# Patient Record
Sex: Male | Born: 1990 | Race: Black or African American | Hispanic: No | Marital: Married | Smoking: Never smoker
Health system: Southern US, Community
[De-identification: ages and names within clinical notes are randomized; demographics above are authoritative.]

## PROBLEM LIST (undated history)

## (undated) DIAGNOSIS — J45909 Unspecified asthma, uncomplicated: Secondary | ICD-10-CM

## (undated) HISTORY — PX: EYE SURGERY: SHX253

---

## 2011-05-19 ENCOUNTER — Ambulatory Visit (INDEPENDENT_AMBULATORY_CARE_PROVIDER_SITE_OTHER): Payer: 59 | Admitting: Family Medicine

## 2011-05-19 VITALS — BP 128/74 | HR 64 | Temp 98.5°F | Resp 16 | Ht 66.0 in | Wt 179.0 lb

## 2011-05-19 DIAGNOSIS — Z0000 Encounter for general adult medical examination without abnormal findings: Secondary | ICD-10-CM

## 2011-05-19 NOTE — Progress Notes (Signed)
@[email or web address] @  Patient ID: Tyler Lee MRN: [id number], DOB: 08/21/1990 21 y.o. Date of Encounter: 05/19/2011, 5:56 PM  Primary Physician: No primary provider on file.  Chief Complaint: Sports Physical   HPI: 21 y.o. y/o male with history of noted below here for CPE/sports physical.  Doing well. No issues/complaints.  No sudden death in the family prior to age 1. No syncope with activity. No murmurs or cardiology evaluations.  Here alone.  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  No sequelae Consitutional: No fever, chills, fatigue, night sweats, lymphadenopathy, or weight changes. Eyes: No visual changes, eye redness, or discharge. ENT/Mouth: Ears: No otalgia, tinnitus, hearing loss, discharge. Nose: No congestion, rhinorrhea, sinus pain, or epistaxis. Throat: No sore throat, post nasal drip, or teeth pain. Cardiovascular: No CP, palpitations, diaphoresis, DOE, or edema. Respiratory: No cough, hemoptysis, SOB, or wheezing. Gastrointestinal: No anorexia, dysphagia, reflux, pain, nausea, vomiting, diarrhea, or constipation. Genitourinary: No dysuria, frequency, urgency, hematuria, incontinence, nocturia, or testicular pain/masses. Musculoskeletal: No decreased ROM, myalgias, stiffness, joint swelling, or weakness. Skin: No rash, erythema, lesion changes, pain, warmth, jaundice, or pruritis. Neurological: No headache, dizziness, syncope, seizures, tremors, memory loss, coordination problems, or paresthesias. Psychological: No anxiety, depression, hallucinations, SI/HI. Endocrine: No fatigue, polydipsia, polyphagia, polyuria, or known diabetes. All other systems were reviewed and are otherwise negative.   Home Meds: asthma meds Prior to Admission medications   Medication Sig Start Date End Date  Taking? Authorizing Provider  albuterol (PROVENTIL HFA;VENTOLIN HFA) 108 (90 BASE) MCG/ACT inhaler Inhale 2 puffs into the lungs every 6 (six) hours as needed.   Yes Historical Provider, MD  Fluticasone-Salmeterol (ADVAIR) 250-50 MCG/DOSE AEPB Inhale 1 puff into the lungs every 12 (twelve) hours.   Yes Historical Provider, MD    Allergies:  Allergies  Allergen Reactions  . Penicillins   . Vancomycin     History   Social History  . Marital Status: Single    Spouse Name: N/A    Number of Children: N/A  . Years of Education: N/A   Occupational History  . Not on file.   Social History Main Topics  . Smoking status: Never Smoker   . Smokeless tobacco: Not on file  . Alcohol Use: Not on file  . Drug Use: Not on file  . Sexually Active: Not on file   Other Topics Concern  . Not on file   Social History Narrative  . No narrative on file    No family history on file.none of significance  Physical Exam:see below Blood pressure 128/74, pulse 64, temperature 98.5 F (36.9 C), temperature source Oral, resp. rate 16, height 5\' 6"  (1.676 m), weight 179 lb (81.194 kg).  General: Well developed, well nourished, in no acute distress. HEENT: Normocephalic, atraumatic. Conjunctiva pink, sclera non-icteric. Pupils 2 mm constricting to 1 mm, round, regular, and equally reactive to light and accomodation. EOMI. Vision reviewed. Internal auditory canal clear. TMs with good cone of light and without pathology. Nasal mucosa pink. Nares are without discharge. No sinus tenderness. Oral mucosa pink. Dentition normal. Pharynx without exudate.   Neck: Supple. Trachea midline. No thyromegaly. Full ROM. No lymphadenopathy. Lungs: Clear to auscultation bilaterally without wheezes, rales, or rhonchi. Breathing is of normal effort and unlabored. Cardiovascular: RRR with S1 S2. No murmurs, rubs, or gallops appreciated. Distal pulses 2+ symmetrically. No carotid  or abdominal bruits. Abdomen: Soft,  non-tender, non-distended with normoactive bowel sounds. No hepatosplenomegaly or masses. No rebound/guarding. No CVA tenderness.  Genitourinary: un- circumcised male. No penile lesions. Testes descended bilaterally, and smooth without tenderness or masses. No hernias. Musculoskeletal: Full range of motion and 5/5 strength throughout. Without swelling, atrophy, tenderness, crepitus, or warmth. Extremities without clubbing, cyanosis, or edema. Calves supple. Skin: Warm and moist without erythema, ecchymosis, wounds, or rash. Neuro: A+Ox3. CN II-XII grossly intact. Moves all extremities spontaneously. Full sensation throughout. Normal gait. DTR 2+ throughout upper and lower extremities. Finger to nose intact. Psych:  Responds to questions appropriately with a normal affect.    Assessment/Plan:  21 y.o. y/o male here for sports physical for sports physical. -Cleared -Form completed -RTC prn  Signed, Jahi Roza MD 05/19/2011 5:56 PM

## 2015-08-02 DIAGNOSIS — F4323 Adjustment disorder with mixed anxiety and depressed mood: Secondary | ICD-10-CM | POA: Diagnosis not present

## 2015-08-18 DIAGNOSIS — H5213 Myopia, bilateral: Secondary | ICD-10-CM | POA: Diagnosis not present

## 2015-08-18 DIAGNOSIS — H52223 Regular astigmatism, bilateral: Secondary | ICD-10-CM | POA: Diagnosis not present

## 2015-09-20 DIAGNOSIS — E669 Obesity, unspecified: Secondary | ICD-10-CM | POA: Diagnosis not present

## 2015-09-20 DIAGNOSIS — J45909 Unspecified asthma, uncomplicated: Secondary | ICD-10-CM | POA: Diagnosis not present

## 2015-09-20 DIAGNOSIS — J309 Allergic rhinitis, unspecified: Secondary | ICD-10-CM | POA: Diagnosis not present

## 2015-09-20 DIAGNOSIS — R0683 Snoring: Secondary | ICD-10-CM | POA: Diagnosis not present

## 2016-04-03 DIAGNOSIS — J45909 Unspecified asthma, uncomplicated: Secondary | ICD-10-CM | POA: Diagnosis not present

## 2016-04-03 DIAGNOSIS — Z23 Encounter for immunization: Secondary | ICD-10-CM | POA: Diagnosis not present

## 2016-04-26 DIAGNOSIS — M542 Cervicalgia: Secondary | ICD-10-CM | POA: Diagnosis not present

## 2016-05-25 DIAGNOSIS — J309 Allergic rhinitis, unspecified: Secondary | ICD-10-CM | POA: Diagnosis not present

## 2016-05-25 DIAGNOSIS — J45909 Unspecified asthma, uncomplicated: Secondary | ICD-10-CM | POA: Diagnosis not present

## 2016-09-28 DIAGNOSIS — H5213 Myopia, bilateral: Secondary | ICD-10-CM | POA: Diagnosis not present

## 2016-09-28 DIAGNOSIS — H52223 Regular astigmatism, bilateral: Secondary | ICD-10-CM | POA: Diagnosis not present

## 2016-10-03 DIAGNOSIS — J45909 Unspecified asthma, uncomplicated: Secondary | ICD-10-CM | POA: Diagnosis not present

## 2016-10-03 DIAGNOSIS — E669 Obesity, unspecified: Secondary | ICD-10-CM | POA: Diagnosis not present

## 2016-10-03 DIAGNOSIS — R0683 Snoring: Secondary | ICD-10-CM | POA: Diagnosis not present

## 2016-10-03 DIAGNOSIS — J302 Other seasonal allergic rhinitis: Secondary | ICD-10-CM | POA: Diagnosis not present

## 2016-12-29 DIAGNOSIS — F4321 Adjustment disorder with depressed mood: Secondary | ICD-10-CM | POA: Diagnosis not present

## 2017-01-03 DIAGNOSIS — F4321 Adjustment disorder with depressed mood: Secondary | ICD-10-CM | POA: Diagnosis not present

## 2017-01-10 DIAGNOSIS — F4321 Adjustment disorder with depressed mood: Secondary | ICD-10-CM | POA: Diagnosis not present

## 2017-01-19 DIAGNOSIS — F4321 Adjustment disorder with depressed mood: Secondary | ICD-10-CM | POA: Diagnosis not present

## 2017-02-01 DIAGNOSIS — F4310 Post-traumatic stress disorder, unspecified: Secondary | ICD-10-CM | POA: Diagnosis not present

## 2017-02-13 DIAGNOSIS — F4310 Post-traumatic stress disorder, unspecified: Secondary | ICD-10-CM | POA: Diagnosis not present

## 2017-04-23 DIAGNOSIS — R0683 Snoring: Secondary | ICD-10-CM | POA: Diagnosis not present

## 2017-04-23 DIAGNOSIS — J45909 Unspecified asthma, uncomplicated: Secondary | ICD-10-CM | POA: Diagnosis not present

## 2017-05-23 DIAGNOSIS — E669 Obesity, unspecified: Secondary | ICD-10-CM | POA: Diagnosis not present

## 2017-05-23 DIAGNOSIS — N529 Male erectile dysfunction, unspecified: Secondary | ICD-10-CM | POA: Diagnosis not present

## 2017-05-23 DIAGNOSIS — R0683 Snoring: Secondary | ICD-10-CM | POA: Diagnosis not present

## 2017-05-23 DIAGNOSIS — J45909 Unspecified asthma, uncomplicated: Secondary | ICD-10-CM | POA: Diagnosis not present

## 2017-10-24 DIAGNOSIS — Z23 Encounter for immunization: Secondary | ICD-10-CM | POA: Diagnosis not present

## 2017-10-24 DIAGNOSIS — S61412A Laceration without foreign body of left hand, initial encounter: Secondary | ICD-10-CM | POA: Diagnosis not present

## 2017-11-05 DIAGNOSIS — Z4802 Encounter for removal of sutures: Secondary | ICD-10-CM | POA: Diagnosis not present

## 2017-11-19 DIAGNOSIS — E669 Obesity, unspecified: Secondary | ICD-10-CM | POA: Diagnosis not present

## 2017-11-19 DIAGNOSIS — Z13220 Encounter for screening for lipoid disorders: Secondary | ICD-10-CM | POA: Diagnosis not present

## 2017-11-19 DIAGNOSIS — N500 Atrophy of testis: Secondary | ICD-10-CM | POA: Diagnosis not present

## 2017-11-19 DIAGNOSIS — Z0000 Encounter for general adult medical examination without abnormal findings: Secondary | ICD-10-CM | POA: Diagnosis not present

## 2017-11-19 DIAGNOSIS — J45909 Unspecified asthma, uncomplicated: Secondary | ICD-10-CM | POA: Diagnosis not present

## 2018-01-16 DIAGNOSIS — Z23 Encounter for immunization: Secondary | ICD-10-CM | POA: Diagnosis not present

## 2018-06-06 DIAGNOSIS — J309 Allergic rhinitis, unspecified: Secondary | ICD-10-CM | POA: Diagnosis not present

## 2018-06-06 DIAGNOSIS — J45909 Unspecified asthma, uncomplicated: Secondary | ICD-10-CM | POA: Diagnosis not present

## 2018-10-30 DIAGNOSIS — Z20828 Contact with and (suspected) exposure to other viral communicable diseases: Secondary | ICD-10-CM | POA: Diagnosis not present

## 2018-11-25 DIAGNOSIS — E669 Obesity, unspecified: Secondary | ICD-10-CM | POA: Diagnosis not present

## 2018-11-25 DIAGNOSIS — Z23 Encounter for immunization: Secondary | ICD-10-CM | POA: Diagnosis not present

## 2018-11-25 DIAGNOSIS — Z0000 Encounter for general adult medical examination without abnormal findings: Secondary | ICD-10-CM | POA: Diagnosis not present

## 2018-11-25 DIAGNOSIS — J45909 Unspecified asthma, uncomplicated: Secondary | ICD-10-CM | POA: Diagnosis not present

## 2019-02-17 DIAGNOSIS — Z20828 Contact with and (suspected) exposure to other viral communicable diseases: Secondary | ICD-10-CM | POA: Diagnosis not present

## 2019-05-27 DIAGNOSIS — E669 Obesity, unspecified: Secondary | ICD-10-CM | POA: Diagnosis not present

## 2019-05-27 DIAGNOSIS — J45909 Unspecified asthma, uncomplicated: Secondary | ICD-10-CM | POA: Diagnosis not present

## 2019-05-27 DIAGNOSIS — R0683 Snoring: Secondary | ICD-10-CM | POA: Diagnosis not present

## 2019-06-16 DIAGNOSIS — Z20828 Contact with and (suspected) exposure to other viral communicable diseases: Secondary | ICD-10-CM | POA: Diagnosis not present

## 2019-06-25 DIAGNOSIS — G4733 Obstructive sleep apnea (adult) (pediatric): Secondary | ICD-10-CM | POA: Diagnosis not present

## 2019-06-26 DIAGNOSIS — G4733 Obstructive sleep apnea (adult) (pediatric): Secondary | ICD-10-CM | POA: Diagnosis not present

## 2019-07-05 ENCOUNTER — Other Ambulatory Visit: Payer: Self-pay

## 2019-07-05 ENCOUNTER — Encounter: Payer: Self-pay | Admitting: Emergency Medicine

## 2019-07-05 ENCOUNTER — Emergency Department
Admission: EM | Admit: 2019-07-05 | Discharge: 2019-07-05 | Disposition: A | Discharge status: Home / Self Care | Payer: BC Managed Care – PPO | Attending: Student in an Organized Health Care Education/Training Program | Admitting: Student in an Organized Health Care Education/Training Program

## 2019-07-05 DIAGNOSIS — Z79899 Other long term (current) drug therapy: Secondary | ICD-10-CM | POA: Diagnosis not present

## 2019-07-05 DIAGNOSIS — J4541 Moderate persistent asthma with (acute) exacerbation: Secondary | ICD-10-CM | POA: Diagnosis not present

## 2019-07-05 DIAGNOSIS — J4531 Mild persistent asthma with (acute) exacerbation: Secondary | ICD-10-CM | POA: Insufficient documentation

## 2019-07-05 DIAGNOSIS — R0602 Shortness of breath: Secondary | ICD-10-CM | POA: Diagnosis not present

## 2019-07-05 HISTORY — DX: Unspecified asthma, uncomplicated: J45.909

## 2019-07-05 MED ORDER — IPRATROPIUM-ALBUTEROL 0.5-2.5 (3) MG/3ML IN SOLN
3.0000 mL | Freq: Once | RESPIRATORY_TRACT | Status: AC
  Administered 2019-07-05: 3 mL via RESPIRATORY_TRACT
  Filled 2019-07-05: qty 3

## 2019-07-05 MED ORDER — PREDNISONE 20 MG PO TABS
60.0000 mg | ORAL_TABLET | Freq: Once | ORAL | Status: AC
  Administered 2019-07-05: 60 mg via ORAL
  Filled 2019-07-05: qty 3

## 2019-07-05 MED ORDER — PREDNISONE 10 MG PO TABS: ORAL_TABLET | ORAL | 0 refills | Status: DC

## 2019-07-05 NOTE — ED Triage Notes (Signed)
Pt c/o shortness of breath since last night, hx of asthma, wheezing present.

## 2019-07-05 NOTE — ED Notes (Signed)
FIRST NURSE NOTE:  Pt c/o difficulty breathing, ambulatory from parking lot, denied wheelchair on arrival.  Pt reports hx of asthma and states he has to get steroids occasionally.  Pt reports using inhalers at home, pt able to speak in complete sentences on arrival.   Mask in place.

## 2019-07-05 NOTE — ED Notes (Signed)
[employer] not working  

## 2019-07-05 NOTE — Discharge Instructions (Signed)
Follow-up with your primary care provider if any continued problems or concerns.  Continue using your albuterol inhaler as prescribed and your Singulair.  The prednisone was sent to your pharmacy.  Begin this medication tomorrow as you had the first dose in the ED today.  This medication is just once a day.  Follow the instructions tapering down by 1 tablet each day until you are completely finished.

## 2019-07-05 NOTE — ED Provider Notes (Signed)
[facility] Emergency Department Provider Note   ____________________________________________   First MD Initiated Contact with Patient 07/05/19 1000     (approximate)  I have reviewed the triage vital signs and the nursing notes.   HISTORY  Chief Complaint Shortness of Breath and Asthma    HPI Tyler Lee is a 29 y.o. male presents to the ED with exacerbation of his asthma.  Patient states that he has been using his albuterol inhaler more frequently.  Patient has a history of asthma and states that it is worse usually in the spring.  Patient denies any fever, chills, nausea or vomiting.  He is unaware of any exposure to Covid.  He denies any muscle aches.  He states that he last used his inhaler 1 hour prior to arrival.      Past Medical History:  Diagnosis Date  . Asthma     There are no problems to display for this patient.   Prior to Admission medications   Medication Sig Start Date End Date Taking? Authorizing Provider  albuterol (PROVENTIL HFA;VENTOLIN HFA) 108 (90 BASE) MCG/ACT inhaler Inhale 2 puffs into the lungs every 6 (six) hours as needed.    [provider]  Fluticasone-Salmeterol (ADVAIR) 250-50 MCG/DOSE AEPB Inhale 1 puff into the lungs every 12 (twelve) hours.    [provider]  predniSONE (DELTASONE) 10 MG tablet Take 5 tablets tomorrow, on day 2 take 4 tablets, day 3 take 3 tablets, day 4 take 2 tablets, day 5 take 1 tablets 07/05/19   Johnn Hai, PA-C    Allergies Penicillins and Vancomycin  No family history on file.  Social History Social History   Tobacco Use  . Smoking status: Never Smoker  . Smokeless tobacco: Never Used  Substance Use Topics  . Alcohol use: Not on file  . Drug use: Not on file    Review of Systems Constitutional: No fever/chills Eyes: No visual changes. ENT: No sore throat. Cardiovascular: Denies chest pain. Respiratory: Denies shortness of breath.  Positive for  wheezing. Gastrointestinal: No abdominal pain.  No nausea, no vomiting.   Musculoskeletal: Negative for muscle aches. Skin: Negative for rash. Neurological: Negative for headaches, focal weakness or numbness. ____________________________________________   PHYSICAL EXAM:  VITAL SIGNS: ED Triage Vitals [07/05/19 0946]  Enc Vitals Group     BP 139/76     Pulse Rate (!) 121     Resp (!) 22     Temp 97.8 F (36.6 C)     Temp Source Oral     SpO2 94 %     Weight      Height      Head Circumference      Peak Flow      Pain Score 0     Pain Loc      Pain Edu?      Excl. in [place]?     Constitutional: Alert and oriented. Well appearing and in no acute distress.  Patient is able to talk in complete sentences without any shortness of breath. Eyes: Conjunctivae are normal. PERRL. EOMI. Head: Atraumatic. Nose: No congestion/rhinnorhea. Mouth/Throat: Mucous membranes are moist.  Oropharynx non-erythematous. Neck: No stridor.   Cardiovascular: Normal rate, regular rhythm. Grossly normal heart sounds.  Good peripheral circulation. Respiratory: Normal respiratory effort.  No retractions. Lungs expiratory wheeze heard throughout. Gastrointestinal: Soft and nontender. No distention.  Musculoskeletal: Moves upper and lower extremities they have difficulty normal gait was noted. Neurologic:  Normal speech and  language. No gross focal neurologic deficits are appreciated. No gait instability. Skin:  Skin is warm, dry and intact. No rash noted. Psychiatric: Mood and affect are normal. Speech and behavior are normal.  ____________________________________________   LABS (all labs ordered are listed, but only abnormal results are displayed)  Labs Reviewed - No data to display  PROCEDURES  Procedure(s) performed (including Critical Care):  Procedures   ____________________________________________   INITIAL IMPRESSION / ASSESSMENT AND PLAN / ED COURSE  As part of my medical decision  making, I reviewed the following data within the electronic MEDICAL RECORD NUMBER Notes from prior ED visits and [place] Controlled Substance Database  29 year old male presents to the ED with complaint of exacerbation of his asthma.  Patient states he has been using his albuterol inhaler more frequently than usual.  He denies any fever, chills, nausea or vomiting.  He states that this feels like his normal asthma flareup and usually has problems during the pollen season.  Patient has had to take steroids in the past.  Physical exam was consistent with mild persistent asthma with expiratory wheezes heard throughout.  Patient was still able to talk in complete sentences without any difficulty.  Patient was given prednisone 60 mg p.o. and to nebulizer treatments while in the ED.  Patient states he was feeling his much better.  We discussed a continued prednisone taper.  He assures me that he has plenty of refills on his albuterol and Singulair.  He will follow-up with his PCP if any continued problems.  ____________________________________________   FINAL CLINICAL IMPRESSION(S) / ED DIAGNOSES  Final diagnoses:  Mild persistent asthma with exacerbation     ED Discharge Orders         Ordered    predniSONE (DELTASONE) 10 MG tablet     07/05/19 1134           Note:  This document was prepared using Dragon voice recognition software and may include unintentional dictation errors.    Tommi Rumps, PA-C 07/05/19 1608    Willy Eddy, MD 07/06/19 [phone]

## 2019-07-24 DIAGNOSIS — G4733 Obstructive sleep apnea (adult) (pediatric): Secondary | ICD-10-CM | POA: Diagnosis not present

## 2019-08-23 DIAGNOSIS — G4733 Obstructive sleep apnea (adult) (pediatric): Secondary | ICD-10-CM | POA: Diagnosis not present

## 2019-09-23 DIAGNOSIS — G4733 Obstructive sleep apnea (adult) (pediatric): Secondary | ICD-10-CM | POA: Diagnosis not present

## 2019-10-23 DIAGNOSIS — G4733 Obstructive sleep apnea (adult) (pediatric): Secondary | ICD-10-CM | POA: Diagnosis not present

## 2019-11-05 DIAGNOSIS — G4733 Obstructive sleep apnea (adult) (pediatric): Secondary | ICD-10-CM | POA: Diagnosis not present

## 2019-11-28 DIAGNOSIS — G4733 Obstructive sleep apnea (adult) (pediatric): Secondary | ICD-10-CM | POA: Diagnosis not present

## 2019-11-28 DIAGNOSIS — E669 Obesity, unspecified: Secondary | ICD-10-CM | POA: Diagnosis not present

## 2019-11-28 DIAGNOSIS — Z23 Encounter for immunization: Secondary | ICD-10-CM | POA: Diagnosis not present

## 2019-11-28 DIAGNOSIS — Z0000 Encounter for general adult medical examination without abnormal findings: Secondary | ICD-10-CM | POA: Diagnosis not present

## 2019-11-28 DIAGNOSIS — Z6831 Body mass index (BMI) 31.0-31.9, adult: Secondary | ICD-10-CM | POA: Diagnosis not present

## 2019-11-28 DIAGNOSIS — J45909 Unspecified asthma, uncomplicated: Secondary | ICD-10-CM | POA: Diagnosis not present

## 2019-12-24 DIAGNOSIS — G4733 Obstructive sleep apnea (adult) (pediatric): Secondary | ICD-10-CM | POA: Diagnosis not present

## 2020-01-08 ENCOUNTER — Other Ambulatory Visit: Payer: Self-pay | Admitting: Urology

## 2020-01-08 ENCOUNTER — Other Ambulatory Visit (HOSPITAL_COMMUNITY): Payer: Self-pay | Admitting: Urology

## 2020-01-08 DIAGNOSIS — N500 Atrophy of testis: Secondary | ICD-10-CM | POA: Diagnosis not present

## 2020-01-08 DIAGNOSIS — Z3009 Encounter for other general counseling and advice on contraception: Secondary | ICD-10-CM | POA: Diagnosis not present

## 2020-01-22 ENCOUNTER — Ambulatory Visit (HOSPITAL_COMMUNITY): Payer: BC Managed Care – PPO

## 2020-01-23 DIAGNOSIS — G4733 Obstructive sleep apnea (adult) (pediatric): Secondary | ICD-10-CM | POA: Diagnosis not present

## 2020-01-26 DIAGNOSIS — R222 Localized swelling, mass and lump, trunk: Secondary | ICD-10-CM | POA: Diagnosis not present

## 2020-02-05 ENCOUNTER — Other Ambulatory Visit: Payer: Self-pay | Admitting: Internal Medicine

## 2020-02-05 ENCOUNTER — Encounter (HOSPITAL_COMMUNITY): Payer: Self-pay

## 2020-02-05 ENCOUNTER — Ambulatory Visit (HOSPITAL_COMMUNITY): Admission: RE | Admit: 2020-02-05 | Payer: BC Managed Care – PPO | Source: Ambulatory Visit

## 2020-02-05 DIAGNOSIS — N63 Unspecified lump in unspecified breast: Secondary | ICD-10-CM

## 2020-02-18 ENCOUNTER — Ambulatory Visit
Admission: RE | Admit: 2020-02-18 | Discharge: 2020-02-18 | Disposition: A | Discharge status: Home / Self Care | Payer: BC Managed Care – PPO | Source: Ambulatory Visit | Attending: Internal Medicine | Admitting: Internal Medicine

## 2020-02-18 ENCOUNTER — Ambulatory Visit: Payer: BC Managed Care – PPO

## 2020-02-18 ENCOUNTER — Other Ambulatory Visit: Payer: Self-pay

## 2020-02-18 DIAGNOSIS — N63 Unspecified lump in unspecified breast: Secondary | ICD-10-CM

## 2020-02-18 DIAGNOSIS — N62 Hypertrophy of breast: Secondary | ICD-10-CM | POA: Diagnosis not present

## 2020-02-23 DIAGNOSIS — G4733 Obstructive sleep apnea (adult) (pediatric): Secondary | ICD-10-CM | POA: Diagnosis not present

## 2020-03-05 DIAGNOSIS — Z302 Encounter for sterilization: Secondary | ICD-10-CM | POA: Diagnosis not present

## 2020-03-24 DIAGNOSIS — G4733 Obstructive sleep apnea (adult) (pediatric): Secondary | ICD-10-CM | POA: Diagnosis not present

## 2020-03-31 ENCOUNTER — Ambulatory Visit (HOSPITAL_COMMUNITY): Admission: RE | Admit: 2020-03-31 | Payer: BC Managed Care – PPO | Source: Ambulatory Visit

## 2020-03-31 ENCOUNTER — Encounter (HOSPITAL_COMMUNITY): Payer: Self-pay

## 2020-04-24 DIAGNOSIS — G4733 Obstructive sleep apnea (adult) (pediatric): Secondary | ICD-10-CM | POA: Diagnosis not present

## 2020-05-24 DIAGNOSIS — G4733 Obstructive sleep apnea (adult) (pediatric): Secondary | ICD-10-CM | POA: Diagnosis not present

## 2020-05-31 DIAGNOSIS — J302 Other seasonal allergic rhinitis: Secondary | ICD-10-CM | POA: Diagnosis not present

## 2020-05-31 DIAGNOSIS — J45909 Unspecified asthma, uncomplicated: Secondary | ICD-10-CM | POA: Diagnosis not present

## 2020-06-22 DIAGNOSIS — G4733 Obstructive sleep apnea (adult) (pediatric): Secondary | ICD-10-CM | POA: Diagnosis not present

## 2020-06-23 ENCOUNTER — Other Ambulatory Visit: Payer: Self-pay | Admitting: Urology

## 2020-06-23 ENCOUNTER — Other Ambulatory Visit (HOSPITAL_COMMUNITY): Payer: Self-pay | Admitting: Urology

## 2020-06-23 DIAGNOSIS — N500 Atrophy of testis: Secondary | ICD-10-CM

## 2020-07-05 ENCOUNTER — Other Ambulatory Visit: Payer: Self-pay

## 2020-07-05 ENCOUNTER — Other Ambulatory Visit (HOSPITAL_COMMUNITY): Payer: Self-pay | Admitting: Urology

## 2020-07-05 ENCOUNTER — Ambulatory Visit (HOSPITAL_BASED_OUTPATIENT_CLINIC_OR_DEPARTMENT_OTHER)
Admission: RE | Admit: 2020-07-05 | Discharge: 2020-07-05 | Disposition: A | Discharge status: Home / Self Care | Payer: BC Managed Care – PPO | Source: Ambulatory Visit | Attending: Urology | Admitting: Urology

## 2020-07-05 DIAGNOSIS — N500 Atrophy of testis: Secondary | ICD-10-CM

## 2020-07-14 DIAGNOSIS — S161XXA Strain of muscle, fascia and tendon at neck level, initial encounter: Secondary | ICD-10-CM | POA: Diagnosis not present

## 2020-07-14 DIAGNOSIS — S39012A Strain of muscle, fascia and tendon of lower back, initial encounter: Secondary | ICD-10-CM | POA: Diagnosis not present

## 2020-07-23 DIAGNOSIS — G4733 Obstructive sleep apnea (adult) (pediatric): Secondary | ICD-10-CM | POA: Diagnosis not present

## 2020-12-02 DIAGNOSIS — Z23 Encounter for immunization: Secondary | ICD-10-CM | POA: Diagnosis not present

## 2020-12-02 DIAGNOSIS — J45909 Unspecified asthma, uncomplicated: Secondary | ICD-10-CM | POA: Diagnosis not present

## 2020-12-02 DIAGNOSIS — J302 Other seasonal allergic rhinitis: Secondary | ICD-10-CM | POA: Diagnosis not present

## 2020-12-02 DIAGNOSIS — G4733 Obstructive sleep apnea (adult) (pediatric): Secondary | ICD-10-CM | POA: Diagnosis not present

## 2020-12-02 DIAGNOSIS — Z0000 Encounter for general adult medical examination without abnormal findings: Secondary | ICD-10-CM | POA: Diagnosis not present

## 2021-01-12 ENCOUNTER — Institutional Professional Consult (permissible substitution): Payer: BC Managed Care – PPO | Admitting: Internal Medicine

## 2021-05-30 DIAGNOSIS — R195 Other fecal abnormalities: Secondary | ICD-10-CM | POA: Diagnosis not present

## 2021-05-30 DIAGNOSIS — R142 Eructation: Secondary | ICD-10-CM | POA: Diagnosis not present

## 2021-06-29 DIAGNOSIS — J45909 Unspecified asthma, uncomplicated: Secondary | ICD-10-CM | POA: Diagnosis not present

## 2021-06-29 DIAGNOSIS — R030 Elevated blood-pressure reading, without diagnosis of hypertension: Secondary | ICD-10-CM | POA: Diagnosis not present

## 2021-07-07 DIAGNOSIS — R002 Palpitations: Secondary | ICD-10-CM | POA: Diagnosis not present

## 2021-07-07 DIAGNOSIS — R2681 Unsteadiness on feet: Secondary | ICD-10-CM | POA: Diagnosis not present

## 2021-07-07 DIAGNOSIS — G4733 Obstructive sleep apnea (adult) (pediatric): Secondary | ICD-10-CM | POA: Diagnosis not present

## 2021-08-12 DIAGNOSIS — R253 Fasciculation: Secondary | ICD-10-CM | POA: Diagnosis not present

## 2021-08-12 DIAGNOSIS — F419 Anxiety disorder, unspecified: Secondary | ICD-10-CM | POA: Diagnosis not present

## 2021-08-15 ENCOUNTER — Encounter: Payer: Self-pay | Admitting: Neurology

## 2021-08-15 DIAGNOSIS — G4733 Obstructive sleep apnea (adult) (pediatric): Secondary | ICD-10-CM | POA: Diagnosis not present

## 2021-08-17 DIAGNOSIS — H1789 Other corneal scars and opacities: Secondary | ICD-10-CM | POA: Diagnosis not present

## 2021-09-14 DIAGNOSIS — G4733 Obstructive sleep apnea (adult) (pediatric): Secondary | ICD-10-CM | POA: Diagnosis not present

## 2021-09-19 DIAGNOSIS — H5213 Myopia, bilateral: Secondary | ICD-10-CM | POA: Diagnosis not present

## 2021-09-19 DIAGNOSIS — H52223 Regular astigmatism, bilateral: Secondary | ICD-10-CM | POA: Diagnosis not present

## 2021-09-19 DIAGNOSIS — H43812 Vitreous degeneration, left eye: Secondary | ICD-10-CM | POA: Diagnosis not present

## 2021-09-26 DIAGNOSIS — H21341 Primary cyst of pars plana, right eye: Secondary | ICD-10-CM | POA: Diagnosis not present

## 2021-09-26 DIAGNOSIS — H33322 Round hole, left eye: Secondary | ICD-10-CM | POA: Diagnosis not present

## 2021-10-19 DIAGNOSIS — H33322 Round hole, left eye: Secondary | ICD-10-CM | POA: Diagnosis not present

## 2021-10-31 DIAGNOSIS — K625 Hemorrhage of anus and rectum: Secondary | ICD-10-CM | POA: Diagnosis not present

## 2021-11-05 IMAGING — MG DIGITAL DIAGNOSTIC BILAT W/ TOMO W/ CAD
6 of 10 series · 6 of 30 positions shown · non-contrast
Comparison: None.

CLINICAL DATA: 29-year-old male filling a palpable lump in the LEFT
breast for approximately 1-2 months with intermittent tenderness.
This is the patient's initial mammogram. Family history of breast
cancer in his mother at age 47.

EXAM:
DIGITAL DIAGNOSTIC BILATERAL MAMMOGRAM WITH TOMO AND CAD

[R CC synth-2D]
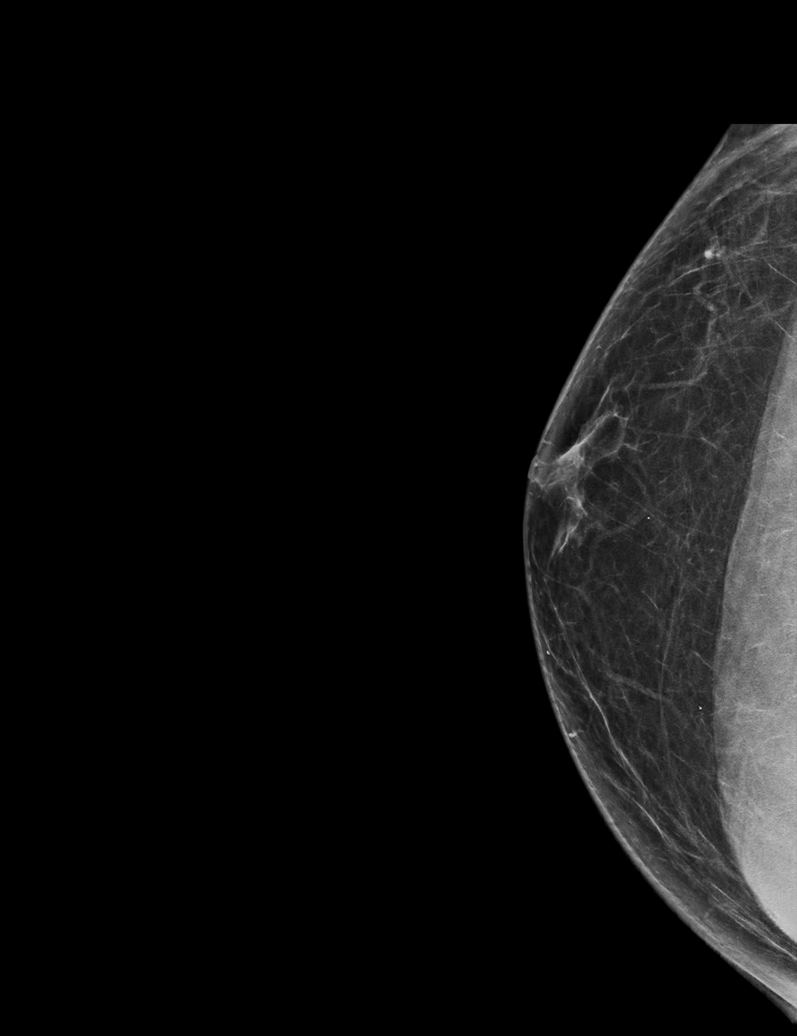

[L CC synth-2D]
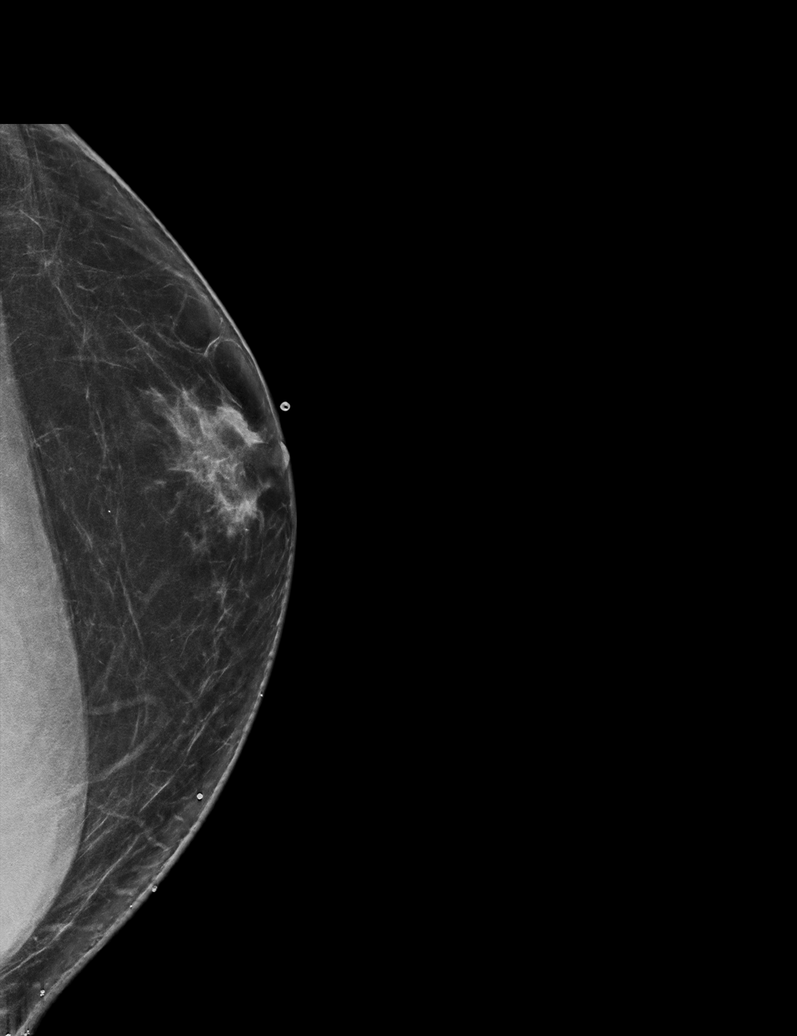

[L TAN synth-2D]
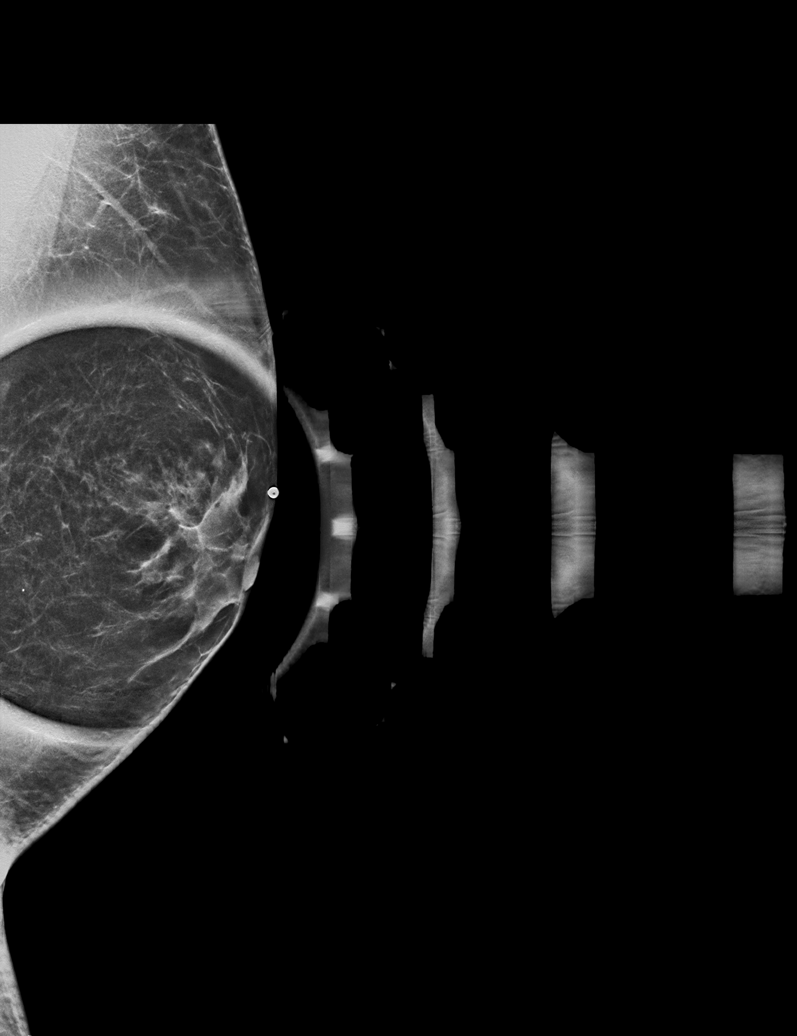

[L MLO synth-2D]
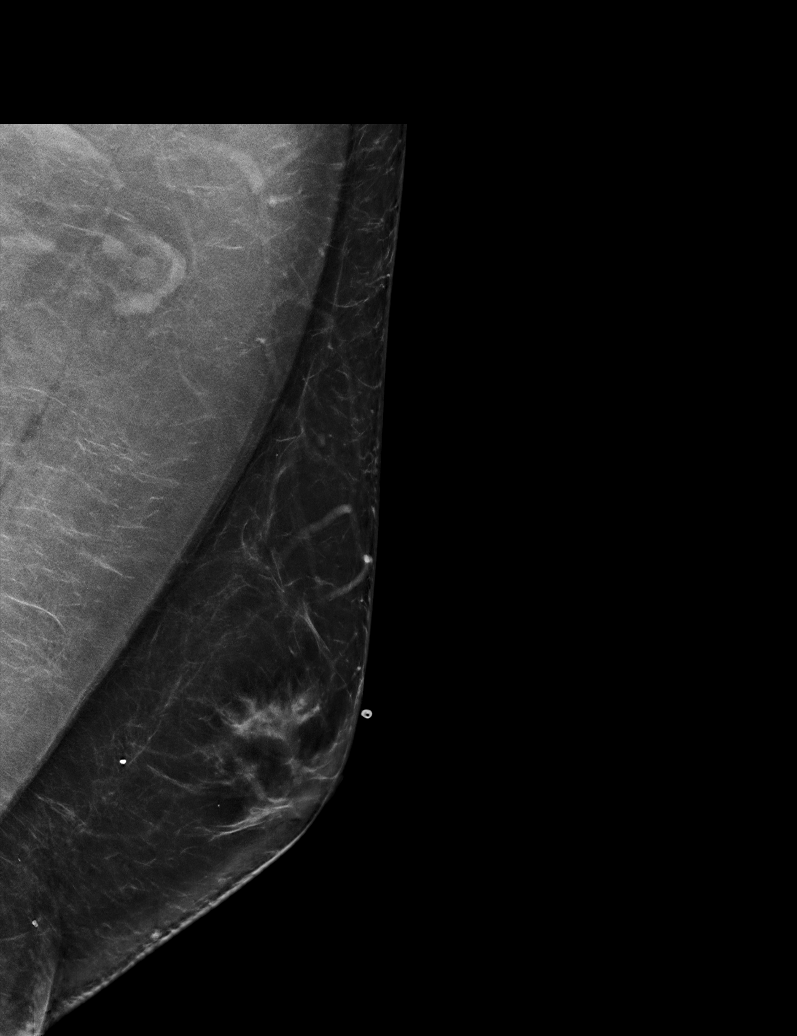

[R MLO synth-2D]
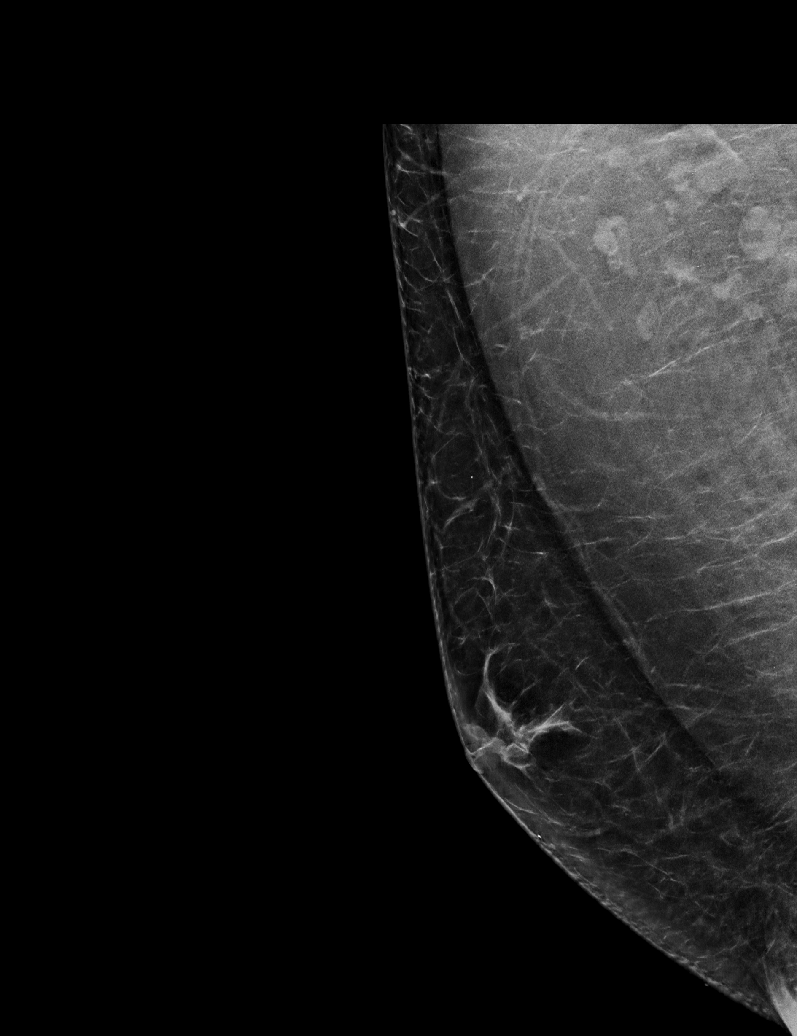

[L MLO tomo · tomo slice 43/85.0]
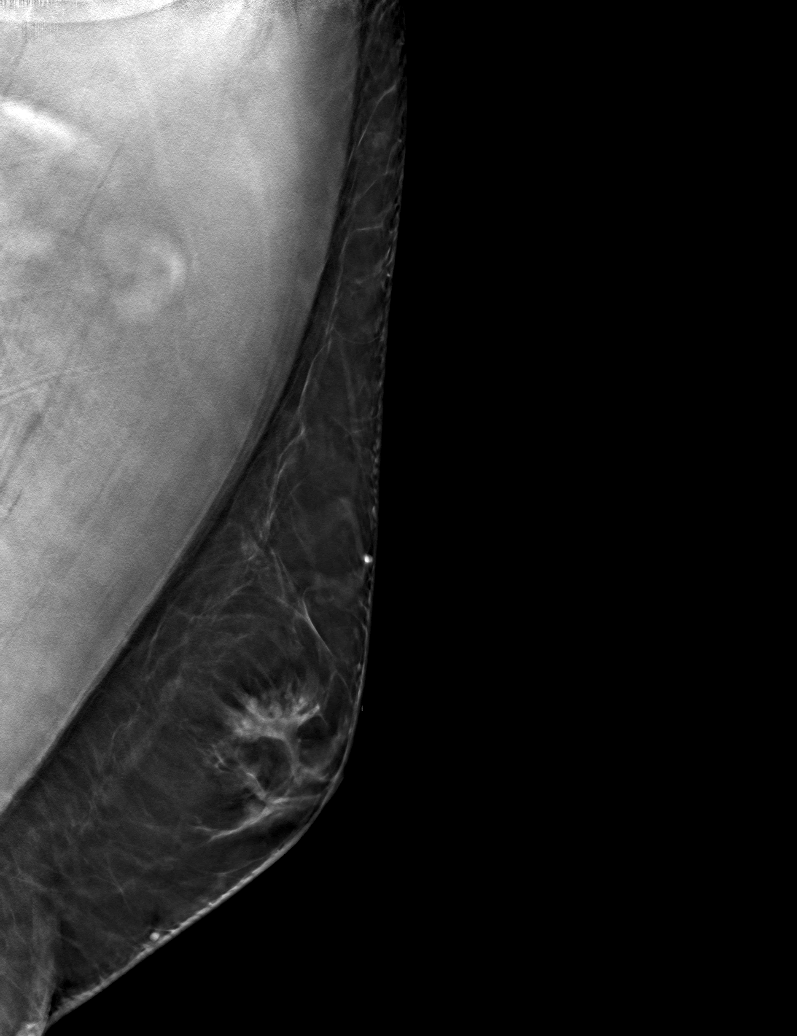

[6 of 30 positions shown; findings below may reference images not displayed]

ACR Breast Density Category b: There are scattered areas of
fibroglandular density.
FINDINGS: Tomosynthesis and synthesized full field CC and MLO views of both
breasts were obtained. Tomosynthesis and synthesized spot
compression tangential view of the subareolar LEFT breast was also
obtained. Mammographic images were processed with CAD.

Moderate to marked LEFT gynecomastia is present. Mild RIGHT
gynecomastia is present.

No findings suspicious for malignancy in either breast.
IMPRESSION: 1. No mammographic evidence of malignancy involving either breast.
2. Moderate to marked LEFT gynecomastia.
3. Mild RIGHT gynecomastia.

RECOMMENDATION:
No further imaging follow-up is felt necessary unless there are
persistent or subsequent clinical concerns.

I discussed with the patient the fact that gynecomastia can occur in
men as testosterone levels decrease with age or in younger men with
low testosterone levels, causing a change in the serum
testosterone:estrogen ratio. We also discussed other potential
etiologies of gynecomastia including numerous prescription
medications and recreational drugs (marijuana and anabolic steroids
in particular). I note that the patient takes albuterol for his
asthma, and gynecomastia is a known side effect of albuterol.
Approximately 20% of cases of gynecomastia are idiopathic.

I counseled the patient to perform self-examination to make sure
that a hard lump does not develop which could indicate malignancy
and would need further evaluation. We also discussed the possibility
of surgical excision if symptoms continue and if an etiology of the
gynecomastia cannot be determined and therefore corrected.

I have discussed the findings and recommendations with the patient.

BI-RADS CATEGORY  2: Benign.

## 2021-11-16 DIAGNOSIS — H31092 Other chorioretinal scars, left eye: Secondary | ICD-10-CM | POA: Diagnosis not present

## 2021-11-16 DIAGNOSIS — H43392 Other vitreous opacities, left eye: Secondary | ICD-10-CM | POA: Diagnosis not present

## 2021-11-16 DIAGNOSIS — H53142 Visual discomfort, left eye: Secondary | ICD-10-CM | POA: Diagnosis not present

## 2021-11-30 DIAGNOSIS — Z0000 Encounter for general adult medical examination without abnormal findings: Secondary | ICD-10-CM | POA: Diagnosis not present

## 2021-11-30 DIAGNOSIS — Z13220 Encounter for screening for lipoid disorders: Secondary | ICD-10-CM | POA: Diagnosis not present

## 2021-11-30 DIAGNOSIS — R748 Abnormal levels of other serum enzymes: Secondary | ICD-10-CM | POA: Diagnosis not present

## 2021-11-30 DIAGNOSIS — H33321 Round hole, right eye: Secondary | ICD-10-CM | POA: Diagnosis not present

## 2021-12-02 ENCOUNTER — Ambulatory Visit: Payer: BC Managed Care – PPO | Admitting: Neurology

## 2022-01-09 ENCOUNTER — Ambulatory Visit: Payer: BC Managed Care – PPO | Admitting: Neurology

## 2022-02-05 NOTE — Progress Notes (Unsigned)
Initial neurology clinic note  SERVICE DATE: 02/06/22  Reason for Evaluation: Consultation requested by Thana Ates, MD for an opinion regarding muscle twitching. My final recommendations will be communicated back to the requesting physician by way of shared medical record or letter to requesting physician via [place] mail.  HPI: This is Mr. Tyler Lee, a 31 y.o. right-handed male with a medical history of asthma and sinus problems/allergy who presents to neurology clinic with the chief complaint of muscle twitching. The patient is alone today.  Patient began having muscle twitching in his left abdomen, temple areas of the head, legs, and arms. It will last ~10 minutes. It resolves on its own. It has been going on since April of 2023. It was worse when he was dehydrated after vacation in May of 2023. He got better with improved hydration. He denies significant change in his strength. It is staying the same (not improving) since improvement after vacation in May of 2023. Patient showed an example from his phone of his left chest that showed a quick twitch near the upper chest/transition into the deltoid area.  Patient also notes "uneasy balance." It was since March or April of 2023. He would notice he was misjudging corners and hitting them. If he turned fast, he may be off balance. He felt unsteady but not dizzy. He denies any falls. He feels his leg strength is good. He thought this could be due to vision. He saw an eye doctor who told him there was something degenerating or detaching. He had laser surgery in July and August in 2023.  Of note, patient had a craniotomy for sinus issues in 2005. He does not know if this has anything to do with this. He has tension in his head with seasonal changes, but no clear pain in the head.  Patient is currently on prednisone 10 mg for asthma currently (for the last week and will continue for another couple of days).  Patient takes zinc drops, B12 drops,  and vit D. He denies significant other supplements or energy drinks. He endorses drinking caffeine (fraps from [employer]) about 2-3 times per week.  There are no neuromuscular respiratory weakness symptoms, particularly orthopnea>dyspnea.   Pseudobulbar affect is absent.  The patient denies any constitutional symptoms like fever, night sweats, anorexia or unintentional weight loss. He has lost about 15 pounds this year. He has tried to watch what he eats and getting more steps.  EtOH use: Currently special occasions, was previously a bigger drinker prior to 2021 (college, etc)  Restrictive diet? No Family history of neuropathy/myopathy/NM disease? No   MEDICATIONS:  Outpatient Encounter Medications as of 02/06/2022  Medication Sig   albuterol (PROVENTIL HFA;VENTOLIN HFA) 108 (90 BASE) MCG/ACT inhaler Inhale 2 puffs into the lungs every 6 (six) hours as needed.   Fluticasone-Salmeterol (ADVAIR) 250-50 MCG/DOSE AEPB Inhale 1 puff into the lungs every 12 (twelve) hours.   predniSONE (DELTASONE) 10 MG tablet Take 5 tablets tomorrow, on day 2 take 4 tablets, day 3 take 3 tablets, day 4 take 2 tablets, day 5 take 1 tablets   No facility-administered encounter medications on file as of 02/06/2022.    PAST MEDICAL HISTORY: Past Medical History:  Diagnosis Date   Asthma     PAST SURGICAL HISTORY: Past Surgical History:  Procedure Laterality Date   EYE SURGERY Bilateral     ALLERGIES: Allergies  Allergen Reactions   Penicillins     Other reaction(s): Other Pt states he can't remember  the reaction. child    Vancomycin     Other reaction(s): Other Patient states "My white blood cell count drops" Lowers blood count     FAMILY HISTORY: Family History  Problem Relation Age of Onset   Breast cancer Mother 103   High blood pressure Mother     SOCIAL HISTORY: Social History   Tobacco Use   Smoking status: Never   Smokeless tobacco: Never  Vaping Use   Vaping Use: Never  used  Substance Use Topics   Alcohol use: Yes    Comment: occas   Drug use: Never   Social History   Social History Narrative   Are you right handed or left handed? Right   Are you currently employed ? 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 Wifeand kids   Caffeine ocas    What type of home do you live in: 1 story or 2 story? two     OBJECTIVE: PHYSICAL EXAM: BP (!) 144/90   Pulse 66   Ht 5\' 7"  (1.702 m)   Wt 235 lb (106.6 kg)   SpO2 99%   BMI 36.81 kg/m   General: General appearance: Awake and alert. No distress. Cooperative with exam.  Skin: No obvious rash or jaundice. HEENT: Atraumatic. Anicteric. Lungs: Non-labored breathing on room air  Extremities: No edema. No obvious deformity.  Musculoskeletal: No obvious joint swelling. Psych: Affect appropriate.  Neurological: Mental Status: Alert. Speech fluent. No pseudobulbar affect Cranial Nerves: CNII: No RAPD. Visual fields grossly intact. CNIII, IV, VI: PERRL. No nystagmus. EOMI. CN V: Facial sensation intact bilaterally to fine touch. [place]eter clench strong. Jaw jerk negative. CN VII: Facial muscles symmetric and strong. No ptosis at rest. CN VIII: Hearing grossly intact bilaterally. CN IX: No hypophonia. CN X: Palate elevates symmetrically. CN XI: Full strength shoulder shrug bilaterally. CN XII: Tongue protrusion full and midline. No atrophy or fasciculations. No significant dysarthria Motor: Tone is normal. No twitching or fasciculations seen today. No atrophy. No grip, eyelid, or percussive myotonia.  Individual muscle group testing (MRC grade out of 5):  Movement     Neck flexion 5    Neck extension 5     Right Left   Shoulder abduction 5 5   Elbow flexion 5 5   Elbow extension 5 5   Finger abduction - FDI 5 5   Finger abduction - ADM 5 5   Finger extension 5 5   Finger distal flexion - 2/3 5 5    Finger distal flexion - 4/5 5 5    Thumb flexion -  FPL 5 5   Thumb abduction - APB 5 5    Hip flexion 5 5   Knee extension 5 5   Knee flexion 5 5   Dorsiflexion 5 5   Plantarflexion 5 5   Inversion 5 5   Eversion 5 5   Great toe extension 5 5   Great toe flexion 5 5     Reflexes:  Right Left   Bicep 2+ 2+   Tricep 2+ 2+   BrRad 2+ 2+   Knee 2+ 2+   Ankle 2+ 2+    Pathological Reflexes: Babinski: mute response bilaterally Hoffman: absent bilaterally Troemner: absent bilaterally Pectoral: absent bilaterally Facial: absent bilaterally Midline tap: absent Sensation: Pinprick: Intact in all extremities Vibration: Intact in all extremities Coordination: Intact finger-to- nose-finger bilaterally. Romberg negative. Gait: Able to rise from chair with arms crossed  unassisted. Normal, narrow-based gait. Able to tandem walk. Able to walk on toes and heels.  Lab and Test Review: External labs: CMP significant for Cr of 1.40 CPK: 119  ASSESSMENT: Tyler Lee is a 31 y.o. male who presents for evaluation of muscle twitching and feeling of imbalance. He has a relevant medical history of asthma and sinus problems s/p surgery. His neurological examination is normal today with no twitching seen. 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 There is no muscle atrophy or weakness or abnormal reflexes to suggest a more sinister etiology such as motor neuron disease (ALS) or MS, which patient was concerned about.   PLAN: -Blood work: ionized Ca, PTH, vit D, CMP, B12 -Over the counter twitching/cramping recommendations given including mag oxide, diet tonic water, hydration, stretching, and avoiding caffeine.  -Return to clinic as needed or with new or worsening symptoms  The impression above as well as the plan as outlined below were extensively discussed with the patient who voiced understanding. All questions were answered to their satisfaction.  When available, results of the above investigations and  possible further recommendations will be communicated to the patient via telephone/MyChart. Patient to call office if not contacted after expected testing turnaround time.   Total time spent reviewing records, interview, history/exam, documentation, and coordination of care on day of encounter:  55 min   Thank you for allowing me to participate in patient's care.  If I can answer any additional questions, I would be pleased to do so.  Jacquelyne Balint, MD   CC: Kirby Funk, MD [address] [place] [address]  CC: Referring provider: Thana Ates, MD [address] [place],  [place] [address]

## 2022-02-06 ENCOUNTER — Other Ambulatory Visit (INDEPENDENT_AMBULATORY_CARE_PROVIDER_SITE_OTHER): Payer: BC Managed Care – PPO

## 2022-02-06 ENCOUNTER — Encounter: Payer: Self-pay | Admitting: Neurology

## 2022-02-06 ENCOUNTER — Ambulatory Visit: Payer: BC Managed Care – PPO | Admitting: Neurology

## 2022-02-06 VITALS — BP 144/90 | HR 66 | Ht 67.0 in | Wt 235.0 lb

## 2022-02-06 DIAGNOSIS — R253 Fasciculation: Secondary | ICD-10-CM

## 2022-02-06 LAB — COMPREHENSIVE METABOLIC PANEL
ALT: 19 U/L (ref 0–53)
AST: 12 U/L (ref 0–37)
Albumin: 4.1 g/dL (ref 3.5–5.2)
Alkaline Phosphatase: 50 U/L (ref 39–117)
BUN: 14 mg/dL (ref 6–23)
CO2: 30 mEq/L (ref 19–32)
Calcium: 9.1 mg/dL (ref 8.4–10.5)
Chloride: 102 mEq/L (ref 96–112)
Creatinine, Ser: 1.23 mg/dL (ref 0.40–1.50)
GFR: 77.3 mL/min (ref >60.00)
Glucose, Bld: 82 mg/dL (ref 70–99)
Potassium: 3.7 mEq/L (ref 3.5–5.1)
Sodium: 139 mEq/L (ref 135–145)
Total Bilirubin: 0.3 mg/dL (ref 0.2–1.2)
Total Protein: 6.9 g/dL (ref 6.0–8.3)

## 2022-02-06 LAB — VITAMIN D 25 HYDROXY (VIT D DEFICIENCY, FRACTURES): VITD: 27.4 ng/mL — ABNORMAL LOW (ref 30.00–100.00)

## 2022-02-06 NOTE — Patient Instructions (Addendum)
Your muscle twitching is likely because of electrolyte imbalance or a condition called "benign fasciculations." As we discussed, there is nothing scary about your exam to suggest a problem such as ALS or MS.  I would like to get labs today to look for imbalances of electrolytes. You will go to 211 on the 2nd floor today for this. I will be in touch when I have your results.  -Recommend the following measures that may provide some symptomatic benefit for muscle twitching and/or cramps: - Adequate oral clear fluid intake to maintain optimal hydration (about 2.5 liters, or around 8-10 glasses per day) Avoidance of caffeine Trial of DIET tonic water: About 1 glass, up to 6 times daily Magnesium oxide up to 400 mg by mouth twice daily, as needed (over the counter) Gentle muscle stretching routine, especially before bedtime  Follow up with me as needed or if anything changes.  The physicians and staff at [facility] are committed to providing excellent care. You may receive a survey requesting feedback about your experience at our office. We strive to receive "very good" responses to the survey questions. If you feel that your experience would prevent you from giving the office a "very good " response, please contact our office to try to remedy the situation. We may be reached at [phone]. Thank you for taking the time out of your busy day to complete the survey.  Jacquelyne Balint, MD [facility]

## 2022-02-07 ENCOUNTER — Telehealth: Payer: Self-pay | Admitting: Neurology

## 2022-02-07 LAB — CALCIUM, IONIZED: Calcium, Ion: 5 mg/dL (ref 4.7–5.5)

## 2022-02-07 LAB — PARATHYROID HORMONE, INTACT (NO CA): PTH: 62 pg/mL (ref 16–77)

## 2022-02-07 NOTE — Telephone Encounter (Signed)
Called and informed pt of results per Dr. Loleta Chance. He understood.

## 2022-02-07 NOTE — Telephone Encounter (Signed)
Pt called in and left a message wanting to get his lab results

## 2022-02-24 ENCOUNTER — Ambulatory Visit: Payer: BC Managed Care – PPO | Admitting: Neurology

## 2022-04-12 DIAGNOSIS — H53142 Visual discomfort, left eye: Secondary | ICD-10-CM | POA: Diagnosis not present

## 2022-04-12 DIAGNOSIS — H43392 Other vitreous opacities, left eye: Secondary | ICD-10-CM | POA: Diagnosis not present

## 2022-04-12 DIAGNOSIS — H43812 Vitreous degeneration, left eye: Secondary | ICD-10-CM | POA: Diagnosis not present

## 2022-04-12 DIAGNOSIS — H31092 Other chorioretinal scars, left eye: Secondary | ICD-10-CM | POA: Diagnosis not present

## 2022-04-26 DIAGNOSIS — H33311 Horseshoe tear of retina without detachment, right eye: Secondary | ICD-10-CM | POA: Diagnosis not present

## 2022-04-26 DIAGNOSIS — H53142 Visual discomfort, left eye: Secondary | ICD-10-CM | POA: Diagnosis not present

## 2022-04-26 DIAGNOSIS — H31093 Other chorioretinal scars, bilateral: Secondary | ICD-10-CM | POA: Diagnosis not present

## 2022-04-26 DIAGNOSIS — H21342 Primary cyst of pars plana, left eye: Secondary | ICD-10-CM | POA: Diagnosis not present

## 2022-05-10 DIAGNOSIS — R195 Other fecal abnormalities: Secondary | ICD-10-CM | POA: Diagnosis not present

## 2022-05-10 DIAGNOSIS — G4733 Obstructive sleep apnea (adult) (pediatric): Secondary | ICD-10-CM | POA: Diagnosis not present

## 2022-05-10 DIAGNOSIS — J45909 Unspecified asthma, uncomplicated: Secondary | ICD-10-CM | POA: Diagnosis not present

## 2022-05-10 DIAGNOSIS — Z79899 Other long term (current) drug therapy: Secondary | ICD-10-CM | POA: Diagnosis not present

## 2022-05-10 DIAGNOSIS — E559 Vitamin D deficiency, unspecified: Secondary | ICD-10-CM | POA: Diagnosis not present

## 2022-05-10 DIAGNOSIS — E669 Obesity, unspecified: Secondary | ICD-10-CM | POA: Diagnosis not present

## 2022-05-17 DIAGNOSIS — H33311 Horseshoe tear of retina without detachment, right eye: Secondary | ICD-10-CM | POA: Diagnosis not present

## 2022-12-04 DIAGNOSIS — E7800 Pure hypercholesterolemia, unspecified: Secondary | ICD-10-CM | POA: Diagnosis not present

## 2022-12-04 DIAGNOSIS — Z0000 Encounter for general adult medical examination without abnormal findings: Secondary | ICD-10-CM | POA: Diagnosis not present

## 2023-11-24 ENCOUNTER — Other Ambulatory Visit: Payer: Self-pay

## 2023-11-24 ENCOUNTER — Emergency Department

## 2023-11-24 ENCOUNTER — Encounter: Payer: Self-pay | Admitting: Emergency Medicine

## 2023-11-24 ENCOUNTER — Emergency Department
Admission: EM | Admit: 2023-11-24 | Discharge: 2023-11-25 | Disposition: A | Discharge status: Home / Self Care | Attending: Emergency Medicine | Admitting: Emergency Medicine

## 2023-11-24 DIAGNOSIS — J4521 Mild intermittent asthma with (acute) exacerbation: Secondary | ICD-10-CM | POA: Insufficient documentation

## 2023-11-24 DIAGNOSIS — R0602 Shortness of breath: Secondary | ICD-10-CM | POA: Diagnosis not present

## 2023-11-24 NOTE — ED Triage Notes (Addendum)
 Patient c/o sob x 2 hours.  Patient speaking in 2-3 word sentences. Patient does have history of asthma, has inspiratory and expiratory wheezes.

## 2023-11-25 DIAGNOSIS — R0602 Shortness of breath: Secondary | ICD-10-CM | POA: Diagnosis not present

## 2023-11-25 LAB — CBC
HCT: 45.1 % (ref 39.0–52.0)
Hemoglobin: 15.3 g/dL (ref 13.0–17.0)
MCH: 28.9 pg (ref 26.0–34.0)
MCHC: 33.9 g/dL (ref 30.0–36.0)
MCV: 85.1 fL (ref 80.0–100.0)
Platelets: 296 K/uL (ref 150–400)
RBC: 5.3 MIL/uL (ref 4.22–5.81)
RDW: 13.1 % (ref 11.5–15.5)
WBC: 7 K/uL (ref 4.0–10.5)
nRBC: 0 % (ref 0.0–0.2)

## 2023-11-25 LAB — BASIC METABOLIC PANEL WITH GFR
Anion gap: 13 (ref 5–15)
BUN: 24 mg/dL — ABNORMAL HIGH (ref 6–20)
CO2: 25 mmol/L (ref 22–32)
Calcium: 9.7 mg/dL (ref 8.9–10.3)
Chloride: 102 mmol/L (ref 98–111)
Creatinine, Ser: 1.68 mg/dL — ABNORMAL HIGH (ref 0.61–1.24)
GFR, Estimated: 54 mL/min — ABNORMAL LOW (ref >60)
Glucose, Bld: 100 mg/dL — ABNORMAL HIGH (ref 70–99)
Potassium: 3.6 mmol/L (ref 3.5–5.1)
Sodium: 140 mmol/L (ref 135–145)

## 2023-11-25 MED ORDER — IPRATROPIUM-ALBUTEROL 0.5-2.5 (3) MG/3ML IN SOLN
3.0000 mL | Freq: Once | RESPIRATORY_TRACT | Status: AC
  Administered 2023-11-25: 3 mL via RESPIRATORY_TRACT
  Filled 2023-11-25: qty 3

## 2023-11-25 MED ORDER — PREDNISONE 20 MG PO TABS
60.0000 mg | ORAL_TABLET | ORAL | Status: AC
  Administered 2023-11-25: 60 mg via ORAL
  Filled 2023-11-25: qty 3

## 2023-11-25 MED ORDER — PREDNISONE 20 MG PO TABS: 60.0000 mg | ORAL_TABLET | Freq: Every day | ORAL | 0 refills | Status: AC

## 2023-11-25 MED ORDER — ALBUTEROL SULFATE (2.5 MG/3ML) 0.083% IN NEBU: 2.5000 mg | INHALATION_SOLUTION | RESPIRATORY_TRACT | 2 refills | Status: AC | PRN

## 2023-11-25 NOTE — ED Provider Notes (Signed)
 [facility] Provider Note    Event Date/Time   First MD Initiated Contact with Patient 11/24/23 2352     (approximate)   History   Shortness of Breath   HPI Tyler Lee is a 33 y.o. male  with a history of asthma who presents for about 2 hours.  States this happens [place] about once a year around the same time of year.  His breathing treatments (albuterol ) at home have not been helping any said that usually prednisone  fixes him up.  No recent fever or cough.  Positive for wheezing and shortness of breath particularly with exertion.  He also said he has been exposed to a lot of dust recently.      Physical Exam   Triage Vital Signs: ED Triage Vitals  Encounter Vitals Group     BP 11/24/23 2347 (!) 145/81     Girls Systolic BP Percentile --      Girls Diastolic BP Percentile --      Boys Systolic BP Percentile --      Boys Diastolic BP Percentile --      Pulse Rate 11/24/23 2347 (!) 121     Resp 11/24/23 2347 (!) 26     Temp 11/24/23 2347 98.6 F (37 C)     Temp Source 11/24/23 2347 Oral     SpO2 11/24/23 2346 93 %     Weight 11/24/23 2347 104.3 kg (230 lb)     Height --      Head Circumference --      Peak Flow --      Pain Score 11/24/23 2347 0     Pain Loc --      Pain Education --      Exclude from Growth Chart --     Most recent vital signs: Vitals:   11/25/23 0025 11/25/23 0100  BP:  128/63  Pulse: (!) 109 (!) 119  Resp:  19  Temp:    SpO2: 96% 100%    General: Awake, mild respiratory distress. CV:  Good peripheral perfusion. Tachycardia, normal heart sounds, regular rhythm. Resp:  Mild tachypnea, no significant accessory muscle usage, just slightly increased respiratory effort.  Expiratory wheezing throughout lung fields. Abd:  No distention.    ED Results / Procedures / Treatments   Labs (all labs ordered are listed, but only abnormal results are displayed) Labs Reviewed  BASIC METABOLIC PANEL WITH GFR - Abnormal;  Notable for the following components:      Result Value   Glucose, Bld 100 (*)    BUN 24 (*)    Creatinine, Ser 1.68 (*)    GFR, Estimated 54 (*)    All other components within normal limits  CBC     EKG  ED ECG REPORT I, Darleene Dome, the attending physician, personally viewed and interpreted this ECG.  Date: 11/24/2023 EKG Time: 23: 48 Rate: 116 Rhythm: Sinus tachycardia QRS Axis: normal Intervals: normal ST/T Wave abnormalities: normal Narrative Interpretation: no evidence of acute ischemia    RADIOLOGY I independently viewed and interpreted the patient's two-view chest x-ray and I see no evidence of pneumonia or pneumothorax.  I also read the radiologist's report, which confirmed no acute findings.   PROCEDURES:  Critical Care performed: No  Procedures    IMPRESSION / MDM / ASSESSMENT AND PLAN / ED COURSE  I reviewed the triage vital signs and the nursing notes.  Differential diagnosis includes, but is not limited to, asthma exacerbation, pneumonia, pneumothorax.  Patient's presentation is most consistent with acute presentation with potential threat to life or bodily function.  Labs/studies ordered: Two-view chest x-ray, BMP, CBC  Interventions/Medications given:  Medications  ipratropium-albuterol  (DUONEB) 0.5-2.5 (3) MG/3ML nebulizer solution 3 mL (3 mLs Nebulization Given 11/25/23 0041)  ipratropium-albuterol  (DUONEB) 0.5-2.5 (3) MG/3ML nebulizer solution 3 mL (3 mLs Nebulization Given 11/25/23 0041)  ipratropium-albuterol  (DUONEB) 0.5-2.5 (3) MG/3ML nebulizer solution 3 mL (3 mLs Nebulization Given 11/25/23 0041)  predniSONE  (DELTASONE ) tablet 60 mg (60 mg Oral Given 11/25/23 0041)    (Note:  hospital course my include additional interventions and/or labs/studies not listed above.)   Vitals are notable for tachypnea and tachycardia, but his presentation is very consistent with a relatively mild asthma exacerbation.  Chest  x-ray clear.  Patient is agreeable to proceeding with DuoNebs x 3 and a dose of 60 mg of prednisone .  I will then reassess and see if additional management is needed.  He was initially satting in the low 90s but he said that he does not think the pulse oximeter was picking up well.  He readjusted on his finger and now he is 98 to 99%.   Clinical Course as of 11/25/23 0154  Sun Nov 25, 2023  0152 I reassessed the patient and he feels much better after his breathing treatments and prednisone .  He feels ready to go home and I think that is reasonable.  Prescriptions as listed below.  I gave my usual return precautions. [CF]    Clinical Course User Index [CF] Gordan Huxley, MD     FINAL CLINICAL IMPRESSION(S) / ED DIAGNOSES   Final diagnoses:  Mild intermittent asthma with exacerbation     Rx / DC Orders   ED Discharge Orders          Ordered    predniSONE  (DELTASONE ) 20 MG tablet  Daily with breakfast        11/25/23 0153    albuterol  (PROVENTIL ) (2.5 MG/3ML) 0.083% nebulizer solution  Every 4 hours PRN        11/25/23 0153             Note:  This document was prepared using Dragon voice recognition software and may include unintentional dictation errors.   Gordan Huxley, MD 11/25/23 [phone]

## 2023-11-25 NOTE — Discharge Instructions (Signed)
 We believe that your symptoms are caused today by an exacerbation of your asthma.  Please take the prescribed medications and any medications that you have at home.  Follow up with your doctor as recommended.  If you develop any new or worsening symptoms, including but not limited to fever, persistent vomiting, worsening shortness of breath, or other symptoms that concern you, please return to the Emergency Department immediately.

## 2023-12-05 DIAGNOSIS — E7800 Pure hypercholesterolemia, unspecified: Secondary | ICD-10-CM | POA: Diagnosis not present

## 2023-12-05 DIAGNOSIS — Z0000 Encounter for general adult medical examination without abnormal findings: Secondary | ICD-10-CM | POA: Diagnosis not present

## 2024-01-22 ENCOUNTER — Encounter (INDEPENDENT_AMBULATORY_CARE_PROVIDER_SITE_OTHER): Payer: Self-pay | Admitting: Otolaryngology

## 2024-01-22 ENCOUNTER — Ambulatory Visit (INDEPENDENT_AMBULATORY_CARE_PROVIDER_SITE_OTHER): Admitting: Otolaryngology

## 2024-01-22 VITALS — BP 137/85 | HR 74 | Ht 67.0 in | Wt 230.0 lb

## 2024-01-22 DIAGNOSIS — R0981 Nasal congestion: Secondary | ICD-10-CM | POA: Diagnosis not present

## 2024-01-22 DIAGNOSIS — J328 Other chronic sinusitis: Secondary | ICD-10-CM

## 2024-01-22 DIAGNOSIS — J3489 Other specified disorders of nose and nasal sinuses: Secondary | ICD-10-CM | POA: Diagnosis not present

## 2024-01-22 DIAGNOSIS — J343 Hypertrophy of nasal turbinates: Secondary | ICD-10-CM | POA: Diagnosis not present

## 2024-01-22 MED ORDER — PREDNISONE 10 MG PO TABS: ORAL_TABLET | ORAL | 0 refills | Status: DC

## 2024-01-22 NOTE — Progress Notes (Signed)
 Dear Dr. Dwight, Here is my assessment for our mutual patient, Tyler Lee. Thank you for allowing me the opportunity to care for your patient. Please do not hesitate to contact me should you have any other questions. Sincerely, Dr. Eldora Blanch  Otolaryngology Clinic Note  HISTORY:  Initial visit (12/2023): Discussed the use of AI scribe software for clinical note transcription with the patient, who gave verbal consent to proceed.  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 He was referred by Dr. Dwight for evaluation of chronic nasal congestion and possible surgical intervention.  He has experienced nasal congestion for approximately 30 years, worsening during allergy seasons. Worse. Nasal obstruction alternates between nostrils, causing difficulty breathing through his nose, especially at night. He has a diminished sense of smell. There are no frequent sinus infections, discolored nasal drainage, or significant pain, though some diffuse facial pressure is present.  Allergy testing in childhood showed allergies to grass, pets, dust, and some trees. He has not received allergy shots. Flonase, Astelin, nasal rinses, Afrin spray, and prednisone  have been ineffective for his nasal symptoms. Prednisone  helps his asthma but not his nasal symptoms.  . No AIT. No previous sinonasal surgery. ASA sens: denies  He is currently using flonase  Tobacco: no  PMHx: Asthma, CKD, AR, OSA (AHI 26)  RADIOGRAPHIC EVALUATION AND INDEPENDENT REVIEW OF OTHER RECORDS:: CBC and BMP 11/24/2023: WBC 7.0; BUN/Cr 24/1.68 Dr. Dwight (12/05/2023): nasal polyps, not responsive to flonase which he has used for years; worse with different seasons; als noted to have snoring ;  Past Medical History:  Diagnosis Date   Asthma    Past Surgical History:  Procedure Laterality Date   EYE SURGERY Bilateral     Family History  Problem Relation Age of Onset   Breast cancer Mother 20   High blood pressure Mother    Social History   Tobacco Use   Smoking status: Never   Smokeless tobacco: Never  Substance Use Topics   Alcohol use: Yes    Comment: occas   Allergies  Allergen Reactions   Penicillins     Other reaction(s): Other Pt states he can't remember the reaction. child    Vancomycin     Other reaction(s): Other Patient states My white blood cell count drops Lowers blood count    Current Outpatient Medications  Medication Sig Dispense Refill   albuterol  (PROVENTIL  HFA;VENTOLIN  HFA) 108 (90 BASE) MCG/ACT inhaler Inhale 2 puffs into the lungs every 6 (six) hours as needed.     albuterol  (PROVENTIL ) (2.5 MG/3ML) 0.083% nebulizer solution Take 3 mLs (2.5 mg total) by nebulization every 4 (four) hours as needed for wheezing or shortness of breath. 75 mL 2   cetirizine (ZYRTEC ALLERGY) 10 MG tablet 1 tablet Orally once a day if needed     fluticasone (FLONASE ALLERGY RELIEF) 50 MCG/ACT nasal spray 2 spray in each nostril Nasally once a day if needed     Fluticasone-Salmeterol (ADVAIR) 250-50 MCG/DOSE AEPB Inhale 1 puff into the lungs every 12 (twelve) hours.     montelukast (SINGULAIR) 10 MG tablet 1 tablet orally once a day if needed; Duration: 90 days     predniSONE  (DELTASONE ) 10 MG tablet Take 3 tablets (30 mg total) by mouth daily with breakfast for 4 days, THEN 2 tablets (20 mg total) daily with breakfast for 4 days, THEN 1 tablet (10 mg total)  daily with breakfast for 4 days. 24 tablet 0   No current facility-administered medications for this visit.   BP 137/85 (BP Location: Right Arm, Patient Position: Sitting, Cuff Size: Large)   Pulse 74   Ht 5' 7 (1.702 m)   Wt 230 lb (104.3 kg)   SpO2 95%   BMI 36.02 kg/m   PHYSICAL EXAM:  BP 137/85 (BP Location: Right Arm, Patient Position: Sitting, Cuff Size: Large)   Pulse 74   Ht 5' 7 (1.702 m)   Wt 230 lb (104.3 kg)    SpO2 95%   BMI 36.02 kg/m    Salient findings:  CN II-XII intact Bilateral EAC clear and TM intact with well pneumatized middle ear spaces Nose: Anterior rhinoscopy reveals significant nasal turbinate hypertrophy with boggy mucosa. Nasal endoscopy was indicated to better evaluate the nose and paranasal sinuses, given the patient's history and exam findings, and is detailed below. No lesions of oral cavity/oropharynx No obviously palpable neck masses/lymphadenopathy/thyromegaly No respiratory distress or stridor   PROCEDURE:  Prior to initiating any procedures, risks/benefits/alternatives were explained to the patient and verbal consent obtained. Diagnostic Nasal Endoscopy Pre-procedure diagnosis: Nasal congestion Post-procedure diagnosis: same Indication: See pre-procedure diagnosis and physical exam above Complications: None apparent EBL: 0 mL Anesthesia: Lidocaine 4% and topical decongestant was topically sprayed in each nasal cavity  Description of Procedure:  Patient was identified. A rigid 30 degree endoscope was utilized to evaluate the sinonasal cavities, mucosa, sinus ostia and turbinates and septum.  Overall, signs of mucosal inflammation are significant and noted. No mucopurulence, polyps, or masses noted.   Unable to see MM and SE recess given significant mucosal edema.  Photodocumentation was obtained.  CPT CODE -- 68768 - Mod 25   ASSESSMENT:  33 y.o. with asthma with:  1. Other chronic sinusitis   2. Nasal congestion   3. Hypertrophy of both inferior nasal turbinates   4. Nasal obstruction    Noted concern for sinusitis with polyposis. He has significant nasal mucosal edema. He reports pred does not help. Possible AI v/s bad allergies? Unclear cause of mucosal inflammation.  PLAN: We've discussed issues and options today.  We reviewed the nasal endoscopy images together.  The risks, benefits and alternatives were discussed and questions answered.    Makes most  sense to start with pred taper and see how he looks with post-treatment CT. Can consider allergy testing/RAST and possible autoimmune labs if no better  1) Pred taper as below 2) Continue flonase BID 3) Post-treatment sinus CT - f/u 6 weeks; keep diary of sx  See below regarding exact medications prescribed this encounter including dosages and route: Meds ordered this encounter  Medications   predniSONE  (DELTASONE ) 10 MG tablet    Sig: Take 3 tablets (30 mg total) by mouth daily with breakfast for 4 days, THEN 2 tablets (20 mg total) daily with breakfast for 4 days, THEN 1 tablet (10 mg total) daily with breakfast for 4 days.    Dispense:  24 tablet    Refill:  0     Thank you for allowing me the opportunity to care for your patient. Please do not hesitate to contact me should you have any other questions.  Sincerely, Eldora Blanch, MD Otolaryngologist (ENT), [facility] Phone: [phone] Fax: [phone]  MDM:  Level 4: [phone] Complexity/Problems addressed: mod - chronic problem with exacerbation Data complexity: mod - independent review of note, lab, ordering test - Morbidity: mod  - Prescription Drug prescribed  or managed: y  01/22/2024, 1:14 PM

## 2024-01-22 NOTE — Patient Instructions (Addendum)
 Take Prednisone  by mouth (PO) 30mg  x 4 days (3 pills in morning), then 20mg  x4 days (2 pills), then 10mg  x 4 days (1 pill), then stop. Risks discussed Keep using flonase I have ordered an imaging study (CT scan) for you to complete prior to your next visit. Please call [facility] Scheduling at [phone] to schedule your imaging if you have not received a call within 24 hours. If you are unable to complete your imaging study prior to your next scheduled visit please call our office to let us  know.  -- do the CT scan in about 4 weeks

## 2024-01-30 ENCOUNTER — Telehealth (INDEPENDENT_AMBULATORY_CARE_PROVIDER_SITE_OTHER): Payer: Self-pay

## 2024-01-30 NOTE — Telephone Encounter (Signed)
 LVM to see which medications patient needs called in.

## 2024-01-30 NOTE — Telephone Encounter (Signed)
 The patient called and left a message that his medications need to be sent to the [facility] on [address] in [place].

## 2024-01-31 MED ORDER — PREDNISONE 10 MG PO TABS: ORAL_TABLET | ORAL | 0 refills | Status: AC

## 2024-01-31 NOTE — Addendum Note (Signed)
 Addended by: Maryland Luppino on: 01/31/2024 12:29 PM   Modules accepted: Orders

## 2024-03-04 ENCOUNTER — Ambulatory Visit (INDEPENDENT_AMBULATORY_CARE_PROVIDER_SITE_OTHER): Admitting: Otolaryngology
